# Patient Record
Sex: Female | Born: 1965 | Race: White | Hispanic: No | Marital: Married | Smoking: Former smoker
Health system: Southern US, Community
[De-identification: ages and names within clinical notes are randomized; demographics above are authoritative.]

## PROBLEM LIST (undated history)

## (undated) DIAGNOSIS — E079 Disorder of thyroid, unspecified: Secondary | ICD-10-CM

## (undated) HISTORY — PX: ABDOMINAL HYSTERECTOMY: SHX81

## (undated) HISTORY — PX: TONSILLECTOMY AND ADENOIDECTOMY: SHX28

## (undated) HISTORY — PX: TONSILLECTOMY: SUR1361

---

## 2008-10-03 ENCOUNTER — Encounter: Admission: RE | Admit: 2008-10-03 | Discharge: 2008-10-03 | Payer: Self-pay | Admitting: Family Medicine

## 2008-10-06 ENCOUNTER — Other Ambulatory Visit: Admission: RE | Admit: 2008-10-06 | Discharge: 2008-10-06 | Payer: Self-pay | Admitting: Family Medicine

## 2010-06-02 ENCOUNTER — Encounter: Payer: Self-pay | Admitting: Family Medicine

## 2010-06-07 ENCOUNTER — Other Ambulatory Visit (HOSPITAL_COMMUNITY)
Admission: RE | Admit: 2010-06-07 | Discharge: 2010-06-07 | Disposition: A | Discharge status: Home / Self Care | Payer: BC Managed Care – PPO | Source: Ambulatory Visit | Attending: Family Medicine | Admitting: Family Medicine

## 2010-06-07 DIAGNOSIS — Z124 Encounter for screening for malignant neoplasm of cervix: Secondary | ICD-10-CM | POA: Insufficient documentation

## 2010-06-12 ENCOUNTER — Other Ambulatory Visit: Payer: Self-pay | Admitting: Family Medicine

## 2010-06-12 DIAGNOSIS — N63 Unspecified lump in unspecified breast: Secondary | ICD-10-CM

## 2010-06-21 ENCOUNTER — Ambulatory Visit
Admission: RE | Admit: 2010-06-21 | Discharge: 2010-06-21 | Disposition: A | Discharge status: Home / Self Care | Payer: BC Managed Care – PPO | Source: Ambulatory Visit | Attending: Family Medicine | Admitting: Family Medicine

## 2010-06-21 ENCOUNTER — Other Ambulatory Visit: Payer: Self-pay | Admitting: Family Medicine

## 2010-06-21 DIAGNOSIS — N63 Unspecified lump in unspecified breast: Secondary | ICD-10-CM

## 2011-01-03 ENCOUNTER — Ambulatory Visit: Payer: BC Managed Care – PPO

## 2011-04-25 ENCOUNTER — Encounter: Payer: Self-pay | Admitting: Emergency Medicine

## 2011-04-25 ENCOUNTER — Emergency Department
Admission: EM | Admit: 2011-04-25 | Discharge: 2011-04-25 | Disposition: A | Discharge status: Home / Self Care | Payer: BC Managed Care – PPO | Source: Home / Self Care | Attending: Emergency Medicine | Admitting: Emergency Medicine

## 2011-04-25 DIAGNOSIS — H04129 Dry eye syndrome of unspecified lacrimal gland: Secondary | ICD-10-CM

## 2011-04-25 DIAGNOSIS — H04123 Dry eye syndrome of bilateral lacrimal glands: Secondary | ICD-10-CM

## 2011-04-25 DIAGNOSIS — L851 Acquired keratosis [keratoderma] palmaris et plantaris: Secondary | ICD-10-CM

## 2011-04-25 DIAGNOSIS — R682 Dry mouth, unspecified: Secondary | ICD-10-CM

## 2011-04-25 MED ORDER — PREDNISONE (PAK) 10 MG PO TABS: 10.0000 mg | ORAL_TABLET | Freq: Every day | ORAL | Status: AC

## 2011-04-25 NOTE — ED Notes (Signed)
D/c'd Accutane txs. In September; now has had extremely dry, flaky skin peri-orbital and facial. No new allergens. No recent ABX

## 2011-04-25 NOTE — ED Provider Notes (Signed)
History     CSN: [id number] Arrival date & time: 04/25/2011  4:31 PM   First MD Initiated Contact with Patient 04/25/11 1634      Chief Complaint  Patient presents with  . Rash    (Consider location/radiation/quality/duration/timing/severity/associated sxs/prior treatment) HPI This patient presents today with dry eyes and dry mouth. She was on Accutane for cystic acne but that was discontinued a few months ago. She was also prescribed tretinoin acid which she states made her skin irritating so she stopped it. Over the last few weeks she has noted increased dryness especially of her eyes and her mouth/lips.  She did not report any muscle pain. She does state that she does have a history of some autoimmune issues. No visual problems or other symptoms are noted. She has been using a lot of lip balm and some over-the-counter creams.  History reviewed. No pertinent past medical history.  Past Surgical History  Procedure Date  . Abdominal hysterectomy     No family history on file.  History  Substance Use Topics  . Smoking status: Not on file  . Smokeless tobacco: Not on file  . Alcohol Use:     OB History    Grav Para Term Preterm Abortions TAB SAB Ect Mult Living                  Review of Systems  Allergies  Penicillins and Sulfa antibiotics  Home Medications   Current Outpatient Rx  Name Route Sig Dispense Refill  . PREDNISONE (PAK) 10 MG PO TABS Oral Take 1 tablet (10 mg total) by mouth daily. 6 day pack, use as directed 1 tablet 0    BP 136/77  Pulse 78  Temp(Src) 98.5 F (36.9 C) (Oral)  Resp 16  Ht 5\' 3"  (1.6 m)  Wt 145 lb (65.772 kg)  BMI 25.69 kg/m2  SpO2 99%  Physical Exam  Nursing note and vitals reviewed. Constitutional: She is oriented to person, place, and time. She appears well-developed and well-nourished.  HENT:  Head: Normocephalic and atraumatic.  Eyes: No scleral icterus.       She is mild conjunctival erythema but no exudate and it  does not appear to be a conjunctivitis.  Neck: Neck supple.  Cardiovascular: Regular rhythm and normal heart sounds.   Pulmonary/Chest: Effort normal and breath sounds normal. No respiratory distress.  Neurological: She is alert and oriented to person, place, and time.  Skin: Skin is warm and dry.       She has some dryness and cracking around her lips.  Psychiatric: She has a normal mood and affect. Her speech is normal.    ED Course  Procedures (including critical care time)  Labs Reviewed - No data to display No results found.   1. Dry eyes   2. Dry mouth       MDM    A symptoms may be due to the fact that she was on Accutane as well as tretinoin acid. This can be irritating to the skin. However with her history of rheumatological disorders, possible that she she may have something such as Sjogren's syndrome or sicca syndrome. I've given her prescription for prednisone to maybe stop the irritating cycle. I've advised she stop using her contacts, but she used artificial tears, use hypoallergenic and non-scented lotions, and to use lip balm. I would also like her to call her dermatologist back and schedule another appointment. I have told her to find out whether or not  they think that this could be some type of autoimmune issue or if they had any other treatment options.  Further treatment of these issues would need to be treated by a specialist.  Lily Kocher, MD 04/25/11 [phone]

## 2011-07-10 ENCOUNTER — Other Ambulatory Visit: Payer: Self-pay | Admitting: Family Medicine

## 2011-07-10 DIAGNOSIS — Z1231 Encounter for screening mammogram for malignant neoplasm of breast: Secondary | ICD-10-CM

## 2011-07-18 ENCOUNTER — Ambulatory Visit
Admission: RE | Admit: 2011-07-18 | Discharge: 2011-07-18 | Disposition: A | Discharge status: Home / Self Care | Payer: BC Managed Care – PPO | Source: Ambulatory Visit | Attending: Family Medicine | Admitting: Family Medicine

## 2011-07-18 DIAGNOSIS — Z1231 Encounter for screening mammogram for malignant neoplasm of breast: Secondary | ICD-10-CM

## 2012-10-20 LAB — HEPATIC FUNCTION PANEL
ALK PHOS: 62 U/L (ref 25–125)
ALT: 12 U/L (ref 7–35)
AST: 14 U/L (ref 13–35)
BILIRUBIN, TOTAL: 1.6 mg/dL

## 2012-10-20 LAB — BASIC METABOLIC PANEL
BUN: 11 mg/dL (ref 4–21)
Creatinine: 0.7 mg/dL (ref 0.5–1.1)
GLUCOSE: 85 mg/dL
Potassium: 4.2 mmol/L (ref 3.4–5.3)
SODIUM: 137 mmol/L (ref 137–147)

## 2012-10-20 LAB — CBC AND DIFFERENTIAL
HEMATOCRIT: 45 % (ref 36–46)
Hemoglobin: 15.4 g/dL (ref 12.0–16.0)
Platelets: 221 10*3/uL (ref 150–399)
WBC: 7 10^3/mL

## 2012-10-20 LAB — HEMOGLOBIN A1C: Hgb A1c MFr Bld: 5.3 % (ref 4.0–6.0)

## 2012-10-20 LAB — TSH: TSH: 1.61 u[IU]/mL (ref 0.41–5.90)

## 2012-11-08 ENCOUNTER — Other Ambulatory Visit: Payer: Self-pay

## 2012-11-08 DIAGNOSIS — Z1231 Encounter for screening mammogram for malignant neoplasm of breast: Secondary | ICD-10-CM

## 2012-11-19 ENCOUNTER — Ambulatory Visit
Admission: RE | Admit: 2012-11-19 | Discharge: 2012-11-19 | Disposition: A | Discharge status: Home / Self Care | Payer: BC Managed Care – PPO | Source: Ambulatory Visit

## 2012-11-19 DIAGNOSIS — Z1231 Encounter for screening mammogram for malignant neoplasm of breast: Secondary | ICD-10-CM

## 2013-05-18 ENCOUNTER — Emergency Department
Admission: EM | Admit: 2013-05-18 | Discharge: 2013-05-18 | Disposition: A | Discharge status: Home / Self Care | Payer: BC Managed Care – PPO | Source: Home / Self Care | Attending: Family Medicine | Admitting: Family Medicine

## 2013-05-18 ENCOUNTER — Encounter: Payer: Self-pay | Admitting: Emergency Medicine

## 2013-05-18 DIAGNOSIS — J069 Acute upper respiratory infection, unspecified: Secondary | ICD-10-CM

## 2013-05-18 HISTORY — DX: Disorder of thyroid, unspecified: E07.9

## 2013-05-18 MED ORDER — AZITHROMYCIN 250 MG PO TABS: ORAL_TABLET | ORAL | Status: DC

## 2013-05-18 MED ORDER — BENZONATATE 200 MG PO CAPS: 200.0000 mg | ORAL_CAPSULE | Freq: Every day | ORAL | Status: DC

## 2013-05-18 NOTE — ED Provider Notes (Signed)
CSN: [id number]     Arrival date & time 05/18/13  1449 History   First MD Initiated Contact with Patient 05/18/13 1544     Chief Complaint  Patient presents with  . Chills  . Cough     HPI Comments: Patient complains of 3 to 4 day history of gradually developing URI symptoms including myalgias, mild sore throat, nausea, fatigue, chills, and mild cough.   She has not had influenza immunization for this season.    The history is provided by the patient.    Past Medical History  Diagnosis Date  . Thyroid disease    Past Surgical History  Procedure Laterality Date  . Abdominal hysterectomy    . Tonsillectomy and adenoidectomy    . Tonsillectomy     Family History  Problem Relation Age of Onset  . Diabetes Mother   . Stroke Mother   . Heart attack Mother   . Cancer Father   . Hypertension Father    History  Substance Use Topics  . Smoking status: Former Games developermoker  . Smokeless tobacco: Never Used  . Alcohol Use: Yes   OB History   Grav Para Term Preterm Abortions TAB SAB Ect Mult Living                 Review of Systems + sore throat + cough No pleuritic pain No wheezing No nasal congestion ? post-nasal drainage No sinus pain/pressure No itchy/red eyes No earache No hemoptysis No SOB No fever, + chills + nausea No vomiting No abdominal pain No diarrhea No urinary symptoms No skin rash + fatigue + myalgias + headache Used OTC meds without relief  Allergies  Penicillins and Sulfa antibiotics  Home Medications   Current Outpatient Rx  Name  Route  Sig  Dispense  Refill  . azithromycin (ZITHROMAX Z-PAK) 250 MG tablet      Take 2 tabs today; then begin one tab once daily for 4 more days. (Rx void after 05/26/13)   6 each   0   . benzonatate (TESSALON) 200 MG capsule   Oral   Take 1 capsule (200 mg total) by mouth at bedtime. Take as needed for cough   12 capsule   0    BP 120/81  Pulse 72  Temp(Src) 98.3 F (36.8 C) (Oral)  Resp 16  Ht 5\' 3"   (1.6 m)  Wt 162 lb (73.483 kg)  BMI 28.70 kg/m2  SpO2 99% Physical Exam Nursing notes and Vital Signs reviewed. Appearance:  Patient appears healthy, stated age, and in no acute distress Eyes:  Pupils are equal, round, and reactive to light and accomodation.  Extraocular movement is intact.  Conjunctivae are not inflamed  Ears:  Canals normal.  Tympanic membranes normal.  Nose:  Mildly congested turbinates.  No sinus tenderness.   Pharynx:  Normal Neck:  Supple.  No adenopathy  Lungs:  Clear to auscultation.  Breath sounds are equal.  Heart:  Regular rate and rhythm without murmurs, rubs, or gallops.  Abdomen:  Nontender without masses or hepatosplenomegaly.  Bowel sounds are present.  No CVA or flank tenderness.  Extremities:  No edema.  No calf tenderness Skin:  No rash present.   ED Course  Procedures        MDM   1. Acute upper respiratory infections of unspecified site; suspect viral URI    There is no evidence of bacterial infection today.  Treat symptomatically for now  Prescription written for Benzonatate [facility](Tessalon) to take  at bedtime for night-time cough.   Take plain Mucinex (1200 mg guaifenesin) twice daily for cough and congestion.  May add Sudafed for sinus congestion.  Increase fluid intake, rest. May use Afrin nasal spray (or generic oxymetazoline) twice daily for about 5 days.  Also recommend using saline nasal spray several times daily and saline nasal irrigation (AYR is a common brand) Try warm salt water gargles for sore throat.  Stop all antihistamines for now, and other non-prescription cough/cold preparations. May take Ibuprofen 200mg , 4 tabs every 8 hours with food for chest/sternum discomfort. Begin Azithromycin if not improving about 5 days or if persistent fever develops (Given a prescription to hold, with an expiration date)   Follow-up with family doctor if not improving 7 to 10 days.      Lattie Haw, MD 05/21/13 1001

## 2013-05-18 NOTE — Discharge Instructions (Signed)
Take plain Mucinex (1200 mg guaifenesin) twice daily for cough and congestion.  May add Sudafed for sinus congestion.   Increase fluid intake, rest. °May use Afrin nasal spray (or generic oxymetazoline) twice daily for about 5 days.  Also recommend using saline nasal spray several times daily and saline nasal irrigation (AYR is a common brand) °Try warm salt water gargles for sore throat.  °Stop all antihistamines for now, and other non-prescription cough/cold preparations. °May take Ibuprofen 200mg, 4 tabs every 8 hours with food for chest/sternum discomfort. °Begin Azithromycin if not improving about 5 days or if persistent fever develops  °Follow-up with family doctor if not improving 7 to 10 days.  °

## 2013-05-18 NOTE — ED Notes (Signed)
Gabrielle Riddle c/o chills/sweats, dry cough, HA and body aches x 4 days. NO flu vac this season.

## 2013-05-20 ENCOUNTER — Ambulatory Visit (INDEPENDENT_AMBULATORY_CARE_PROVIDER_SITE_OTHER): Payer: BC Managed Care – PPO

## 2013-05-20 ENCOUNTER — Ambulatory Visit (INDEPENDENT_AMBULATORY_CARE_PROVIDER_SITE_OTHER): Payer: BC Managed Care – PPO | Admitting: Physician Assistant

## 2013-05-20 ENCOUNTER — Encounter: Payer: Self-pay | Admitting: Physician Assistant

## 2013-05-20 VITALS — BP 121/61 | HR 98 | Ht 62.5 in | Wt 161.0 lb

## 2013-05-20 DIAGNOSIS — N904 Leukoplakia of vulva: Secondary | ICD-10-CM

## 2013-05-20 DIAGNOSIS — F39 Unspecified mood [affective] disorder: Secondary | ICD-10-CM

## 2013-05-20 DIAGNOSIS — R4586 Emotional lability: Secondary | ICD-10-CM

## 2013-05-20 DIAGNOSIS — L940 Localized scleroderma [morphea]: Secondary | ICD-10-CM

## 2013-05-20 DIAGNOSIS — M79671 Pain in right foot: Secondary | ICD-10-CM

## 2013-05-20 DIAGNOSIS — N951 Menopausal and female climacteric states: Secondary | ICD-10-CM

## 2013-05-20 DIAGNOSIS — M79609 Pain in unspecified limb: Secondary | ICD-10-CM

## 2013-05-20 DIAGNOSIS — E559 Vitamin D deficiency, unspecified: Secondary | ICD-10-CM

## 2013-05-20 DIAGNOSIS — M7730 Calcaneal spur, unspecified foot: Secondary | ICD-10-CM

## 2013-05-20 DIAGNOSIS — K219 Gastro-esophageal reflux disease without esophagitis: Secondary | ICD-10-CM

## 2013-05-20 DIAGNOSIS — R232 Flushing: Secondary | ICD-10-CM

## 2013-05-20 DIAGNOSIS — I8390 Asymptomatic varicose veins of unspecified lower extremity: Secondary | ICD-10-CM

## 2013-05-20 MED ORDER — OMEPRAZOLE 40 MG PO CPDR: 40.0000 mg | DELAYED_RELEASE_CAPSULE | Freq: Every day | ORAL | Status: AC

## 2013-05-20 MED ORDER — VENLAFAXINE HCL ER 37.5 MG PO CP24: 37.5000 mg | ORAL_CAPSULE | Freq: Every day | ORAL | Status: DC

## 2013-05-20 NOTE — Patient Instructions (Addendum)
Xray of right foot. Use ibuprofen as needed.   Heel Spur A heel spur is a hook of bone that can form on the calcaneus (the heel bone and the largest bone of the foot). Heel spurs are often associated with plantar fasciitis and usually come in people who have had the problem for an extended period of time. The cause of the relationship is unknown. The pain associated with them is thought to be caused by an inflammation (soreness and redness) of the plantar fascia rather than the spur itself. The plantar fascia is a thick fibrous like tissue that runs from the calcaneus (heel bone) to the ball of the foot. This strong, tight tissue helps maintain the arch of your foot. It helps distribute the weight across your foot as you walk or run. Stresses placed on the plantar fascia can be tremendous. When it is inflamed normal activities become painful. Pain is worse in the morning after sleeping. After sleeping the plantar fascia is tight. The first movements stretch the fascia and this causes pain. As the tendon loosens, the pain usually gets better. It often returns with too much standing or walking.  About 70% of patients with plantar fasciitis have a heel spur. About half of people without foot pain also have heel spurs. DIAGNOSIS  The diagnosis of a heel spur is made by X-ray. The X-ray shows a hook of bone protruding from the bottom of the calcaneus at the point where the plantar fascia is attached to the heel bone.  TREATMENT  It is necessary to find out what is causing the stretching of the plantar fascia. If the cause is over-pronation (flat feet), orthotics and proper foot ware may help.  Stretching exercises, losing weight, wearing shoes that have a cushioned heel that absorbs shock, and elevating the heel with the use of a heel cradle, heel cup, or orthotics may all help. Heel cradles and heel cups provide extra comfort and cushion to the heel, and reduce the amount of shock to the sore area. AVOIDING  THE PAIN OF PLANTAR FASCIITIS AND HEEL SPURS  Consult a sports medicine professional before beginning a new exercise program.  Walking programs offer a good workout. There is a lower chance of overuse injuries common to the runners. There is less impact and less jarring of the joints.  Begin all new exercise programs slowly. If problems or pains develop, decrease the amount of time or distance until you are at a comfortable level.  Wear good shoes and replace them regularly.  Stretch your foot and the heel cords at the back of the ankle (Achilles tendons) both before and after exercise.  Run or exercise on even surfaces that are not hard. For example, asphalt is better than pavement.  Do not run barefoot on hard surfaces.  If using a treadmill, vary the incline.  Do not continue to workout if you have foot or joint problems. Seek professional help if they do not improve. HOME CARE INSTRUCTIONS   Avoid activities that cause you pain until you recover.  Use ice or cold packs to the problem or painful areas after working out.  Only take over-the-counter or prescription medicines for pain, discomfort, or fever as directed by your caregiver.  Soft shoe inserts or athletic shoes with air or gel sole cushions may be helpful.  If problems continue or become more severe, consult a sports medicine caregiver. Cortisone is a potent anti-inflammatory medication that may be injected into the painful area. You can  discuss this treatment with your caregiver. MAKE SURE YOU:   Understand these instructions.  Will watch your condition.  Will get help right away if you are not doing well or get worse. Document Released: 06/04/2005 Document Revised: 07/21/2011 Document Reviewed: 08/06/2005 [facility] Patient Information 2014 [place], [place].   Plantar Fasciitis (Heel Spur Syndrome) with Rehab The plantar fascia is a fibrous, ligament-like, soft-tissue structure that spans the bottom of the foot.  Plantar fasciitis is a condition that causes pain in the foot due to inflammation of the tissue. SYMPTOMS   Pain and tenderness on the underneath side of the foot.  Pain that worsens with standing or walking. CAUSES  Plantar fasciitis is caused by irritation and injury to the plantar fascia on the underneath side of the foot. Common mechanisms of injury include:  Direct trauma to bottom of the foot.  Damage to a small nerve that runs under the foot where the main fascia attaches to the heel bone.  Stress placed on the plantar fascia due to bone spurs. RISK INCREASES WITH:   Activities that place stress on the plantar fascia (running, jumping, pivoting, or cutting).  Poor strength and flexibility.  Improperly fitted shoes.  Tight calf muscles.  Flat feet.  Failure to warm-up properly before activity.  Obesity. PREVENTION  Warm up and stretch properly before activity.  Allow for adequate recovery between workouts.  Maintain physical fitness:  Strength, flexibility, and endurance.  Cardiovascular fitness.  Maintain a health body weight.  Avoid stress on the plantar fascia.  Wear properly fitted shoes, including arch supports for individuals who have flat feet. PROGNOSIS  If treated properly, then the symptoms of plantar fasciitis usually resolve without surgery. However, occasionally surgery is necessary. RELATED COMPLICATIONS   Recurrent symptoms that may result in a chronic condition.  Problems of the lower back that are caused by compensating for the injury, such as limping.  Pain or weakness of the foot during push-off following surgery.  Chronic inflammation, scarring, and partial or complete fascia tear, occurring more often from repeated injections. TREATMENT  Treatment initially involves the use of ice and medication to help reduce pain and inflammation. The use of strengthening and stretching exercises may help reduce pain with activity, especially  stretches of the Achilles tendon. These exercises may be performed at home or with a therapist. Your caregiver may recommend that you use heel cups of arch supports to help reduce stress on the plantar fascia. Occasionally, corticosteroid injections are given to reduce inflammation. If symptoms persist for greater than 6 months despite non-surgical (conservative), then surgery may be recommended.  MEDICATION   If pain medication is necessary, then nonsteroidal anti-inflammatory medications, such as aspirin and ibuprofen, or other minor pain relievers, such as acetaminophen, are often recommended.  Do not take pain medication within 7 days before surgery.  Prescription pain relievers may be given if deemed necessary by your caregiver. Use only as directed and only as much as you need.  Corticosteroid injections may be given by your caregiver. These injections should be reserved for the most serious cases, because they may only be given a certain number of times. HEAT AND COLD  Cold treatment (icing) relieves pain and reduces inflammation. Cold treatment should be applied for 10 to 15 minutes every 2 to 3 hours for inflammation and pain and immediately after any activity that aggravates your symptoms. Use ice packs or massage the area with a piece of ice (ice massage).  Heat treatment may be used prior  to performing the stretching and strengthening activities prescribed by your caregiver, physical therapist, or athletic trainer. Use a heat pack or soak the injury in warm water. SEEK IMMEDIATE MEDICAL CARE IF:  Treatment seems to offer no benefit, or the condition worsens.  Any medications produce adverse side effects. EXERCISES RANGE OF MOTION (ROM) AND STRETCHING EXERCISES - Plantar Fasciitis (Heel Spur Syndrome) These exercises may help you when beginning to rehabilitate your injury. Your symptoms may resolve with or without further involvement from your physician, physical therapist or  athletic trainer. While completing these exercises, remember:   Restoring tissue flexibility helps normal motion to return to the joints. This allows healthier, less painful movement and activity.  An effective stretch should be held for at least 30 seconds.  A stretch should never be painful. You should only feel a gentle lengthening or release in the stretched tissue. RANGE OF MOTION - Toe Extension, Flexion  Sit with your right / left leg crossed over your opposite knee.  Grasp your toes and gently pull them back toward the top of your foot. You should feel a stretch on the bottom of your toes and/or foot.  Hold this stretch for __________ seconds.  Now, gently pull your toes toward the bottom of your foot. You should feel a stretch on the top of your toes and or foot.  Hold this stretch for __________ seconds. Repeat __________ times. Complete this stretch __________ times per day.  RANGE OF MOTION - Ankle Dorsiflexion, Active Assisted  Remove shoes and sit on a chair that is preferably not on a carpeted surface.  Place right / left foot under knee. Extend your opposite leg for support.  Keeping your heel down, slide your right / left foot back toward the chair until you feel a stretch at your ankle or calf. If you do not feel a stretch, slide your bottom forward to the edge of the chair, while still keeping your heel down.  Hold this stretch for __________ seconds. Repeat __________ times. Complete this stretch __________ times per day.  STRETCH  Gastroc, Standing  Place hands on wall.  Extend right / left leg, keeping the front knee somewhat bent.  Slightly point your toes inward on your back foot.  Keeping your right / left heel on the floor and your knee straight, shift your weight toward the wall, not allowing your back to arch.  You should feel a gentle stretch in the right / left calf. Hold this position for __________ seconds. Repeat __________ times. Complete this  stretch __________ times per day. STRETCH  Soleus, Standing  Place hands on wall.  Extend right / left leg, keeping the other knee somewhat bent.  Slightly point your toes inward on your back foot.  Keep your right / left heel on the floor, bend your back knee, and slightly shift your weight over the back leg so that you feel a gentle stretch deep in your back calf.  Hold this position for __________ seconds. Repeat __________ times. Complete this stretch __________ times per day. STRETCH  Gastrocsoleus, Standing  Note: This exercise can place a lot of stress on your foot and ankle. Please complete this exercise only if specifically instructed by your caregiver.   Place the ball of your right / left foot on a step, keeping your other foot firmly on the same step.  Hold on to the wall or a rail for balance.  Slowly lift your other foot, allowing your body weight to press  your heel down over the edge of the step.  You should feel a stretch in your right / left calf.  Hold this position for __________ seconds.  Repeat this exercise with a slight bend in your right / left knee. Repeat __________ times. Complete this stretch __________ times per day.  STRENGTHENING EXERCISES - Plantar Fasciitis (Heel Spur Syndrome)  These exercises may help you when beginning to rehabilitate your injury. They may resolve your symptoms with or without further involvement from your physician, physical therapist or athletic trainer. While completing these exercises, remember:   Muscles can gain both the endurance and the strength needed for everyday activities through controlled exercises.  Complete these exercises as instructed by your physician, physical therapist or athletic trainer. Progress the resistance and repetitions only as guided. STRENGTH - Towel Curls  Sit in a chair positioned on a non-carpeted surface.  Place your foot on a towel, keeping your heel on the floor.  Pull the towel toward  your heel by only curling your toes. Keep your heel on the floor.  If instructed by your physician, physical therapist or athletic trainer, add ____________________ at the end of the towel. Repeat __________ times. Complete this exercise __________ times per day. STRENGTH - Ankle Inversion  Secure one end of a rubber exercise band/tubing to a fixed object (table, pole). Loop the other end around your foot just before your toes.  Place your fists between your knees. This will focus your strengthening at your ankle.  Slowly, pull your big toe up and in, making sure the band/tubing is positioned to resist the entire motion.  Hold this position for __________ seconds.  Have your muscles resist the band/tubing as it slowly pulls your foot back to the starting position. Repeat __________ times. Complete this exercises __________ times per day.  Document Released: 04/28/2005 Document Revised: 07/21/2011 Document Reviewed: 08/10/2008 [facility]ExitCare Patient Information 2014 [place].

## 2013-05-20 NOTE — Progress Notes (Signed)
   Subjective:    Patient ID: Gabrielle Riddle, female    DOB: 1965-12-20, 48 y.o.   MRN: [id number]  HPI Pt is a 48 yo female who presents to the clinic to establish care. PMH is negative for any ongoing conditions. She is currently not on any medications.  . Active Ambulatory Problems    Diagnosis Date Noted  . Lichen sclerosus of female genitalia 05/22/2013  . Vitamin D deficiency 05/22/2013  . Varicose veins 05/22/2013   Resolved Ambulatory Problems    Diagnosis Date Noted  . No Resolved Ambulatory Problems   Past Medical History  Diagnosis Date  . Thyroid disease    Pt is aware needs CPE. A few concerns today.   Right heel pain started 2 weeks ago. Pain made worse with palpation and pressure over right heel. Trying to walk more but very painful. Hx of varicose veins. Tried nothing to make better.   Hot flashes started really bad in the last month. She will have 5-15 a day. She would like something to help. Tried nothing. Hysterectomy.     GERD hx- controlled with prilosec.    Review of Systems  All other systems reviewed and are negative.       Objective:   Physical Exam  Constitutional: She is oriented to person, place, and time. She appears well-developed and well-nourished.  HENT:  Head: Normocephalic and atraumatic.  Cardiovascular: Normal rate, regular rhythm and normal heart sounds.   Pulmonary/Chest: Effort normal and breath sounds normal.  Musculoskeletal:  Pain with palpation over right heel. No pain over fascia. ROM and strength of right foot normal without pain.   Neurological: She is alert and oriented to person, place, and time.  Psychiatric: She has a normal mood and affect. Her behavior is normal.          Assessment & Plan:  Heel pain, right- suspect heel spur. Gave HO. Start icing, rest, and exercises. Ibuprofen for pain control.  Will get xray to confirm.   GERD- Refilled prilosec.   Hot flashes/mood swings-PHQ-9 was 4. GAD-7 was 8. Mood  swings are likely due to hormonal changes. Will try effexor for both hot flashes and mood swings. Increase to 75mg  daily after 1 week. Follow up in 6 weeks. Discussed HRT. Would like to try other options first.

## 2013-05-22 DIAGNOSIS — R232 Flushing: Secondary | ICD-10-CM | POA: Insufficient documentation

## 2013-05-22 DIAGNOSIS — I8390 Asymptomatic varicose veins of unspecified lower extremity: Secondary | ICD-10-CM | POA: Insufficient documentation

## 2013-05-22 DIAGNOSIS — N904 Leukoplakia of vulva: Secondary | ICD-10-CM | POA: Insufficient documentation

## 2013-05-22 DIAGNOSIS — R4586 Emotional lability: Secondary | ICD-10-CM | POA: Insufficient documentation

## 2013-05-22 DIAGNOSIS — E559 Vitamin D deficiency, unspecified: Secondary | ICD-10-CM | POA: Insufficient documentation

## 2013-05-22 DIAGNOSIS — K219 Gastro-esophageal reflux disease without esophagitis: Secondary | ICD-10-CM | POA: Insufficient documentation

## 2013-05-22 DIAGNOSIS — M79673 Pain in unspecified foot: Secondary | ICD-10-CM | POA: Insufficient documentation

## 2013-06-15 ENCOUNTER — Encounter: Payer: Self-pay | Admitting: *Deleted

## 2013-06-17 ENCOUNTER — Encounter: Payer: Self-pay | Admitting: Physician Assistant

## 2013-06-17 ENCOUNTER — Ambulatory Visit (INDEPENDENT_AMBULATORY_CARE_PROVIDER_SITE_OTHER): Payer: BC Managed Care – PPO | Admitting: Physician Assistant

## 2013-06-17 VITALS — BP 112/70 | HR 66 | Wt 161.0 lb

## 2013-06-17 DIAGNOSIS — Z131 Encounter for screening for diabetes mellitus: Secondary | ICD-10-CM

## 2013-06-17 DIAGNOSIS — R4586 Emotional lability: Secondary | ICD-10-CM

## 2013-06-17 DIAGNOSIS — E559 Vitamin D deficiency, unspecified: Secondary | ICD-10-CM

## 2013-06-17 DIAGNOSIS — F39 Unspecified mood [affective] disorder: Secondary | ICD-10-CM

## 2013-06-17 DIAGNOSIS — R232 Flushing: Secondary | ICD-10-CM

## 2013-06-17 DIAGNOSIS — R7301 Impaired fasting glucose: Secondary | ICD-10-CM

## 2013-06-17 DIAGNOSIS — N951 Menopausal and female climacteric states: Secondary | ICD-10-CM

## 2013-06-17 DIAGNOSIS — Z13220 Encounter for screening for lipoid disorders: Secondary | ICD-10-CM

## 2013-06-17 MED ORDER — VENLAFAXINE HCL ER 37.5 MG PO CP24: 37.5000 mg | ORAL_CAPSULE | Freq: Every day | ORAL | Status: DC

## 2013-06-17 NOTE — Progress Notes (Signed)
   Subjective:    Patient ID: Gabrielle DienerMollie Hunt, female    DOB: 03/11/1966, 48 y.o.   MRN: [id number]  HPI Patient is a 48 year old female who presents to the clinic to followup own mood swings and hot flashes. She started Effexor 37.5 mg and she is feeling 80% improvement of symptoms. Patient reports that she feels much happier and has more energy. Her mood seems much more stable. She tried to increase 37.5-2 tabs but felt like it made her more fatigued. She is very happy with the benefits at the 37.5 mg marked on like to stay there.  Patient would also like to get fasting labs slip today.   Review of Systems     Objective:   Physical Exam  Constitutional: She is oriented to person, place, and time. She appears well-developed and well-nourished.  HENT:  Head: Normocephalic and atraumatic.  Cardiovascular: Normal rate, regular rhythm and normal heart sounds.   Pulmonary/Chest: Effort normal and breath sounds normal.  Neurological: She is alert and oriented to person, place, and time.  Skin: Skin is dry.  Psychiatric: She has a normal mood and affect. Her behavior is normal.          Assessment & Plan:  Mood swings/Hot flashes- am very happy with 80% improvement of symptoms. Will continue on Effexor 37.5 mg once a day. Refilled for 6 months. Patient can followup at any time if symptoms seem to be coming back. Encouraged regular exercise.  Screening labs for cholesterol, diabetes and vitamin D deficiency were ordered today. Patient aware to get labs done when fasting for at least 8 hours.

## 2013-06-24 LAB — COMPLETE METABOLIC PANEL WITH GFR
ALK PHOS: 58 U/L (ref 39–117)
ALT: 17 U/L (ref 0–35)
AST: 16 U/L (ref 0–37)
Albumin: 4 g/dL (ref 3.5–5.2)
BILIRUBIN TOTAL: 0.9 mg/dL (ref 0.2–1.2)
BUN: 13 mg/dL (ref 6–23)
CO2: 27 mEq/L (ref 19–32)
Calcium: 9.4 mg/dL (ref 8.4–10.5)
Chloride: 106 mEq/L (ref 96–112)
Creat: 0.6 mg/dL (ref 0.50–1.10)
GFR, Est African American: >89 mL/min
GFR, Est Non African American: >89 mL/min
Glucose, Bld: 112 mg/dL — ABNORMAL HIGH (ref 70–99)
Potassium: 4.3 mEq/L (ref 3.5–5.3)
SODIUM: 140 meq/L (ref 135–145)
TOTAL PROTEIN: 6.8 g/dL (ref 6.0–8.3)

## 2013-06-24 LAB — LIPID PANEL
CHOL/HDL RATIO: 3.4 ratio
Cholesterol: 183 mg/dL (ref 0–200)
HDL: 54 mg/dL (ref >39)
LDL Cholesterol: 112 mg/dL — ABNORMAL HIGH (ref 0–99)
Triglycerides: 86 mg/dL (ref <150)
VLDL: 17 mg/dL (ref 0–40)

## 2013-06-24 LAB — TSH: TSH: 3.023 u[IU]/mL (ref 0.350–4.500)

## 2013-06-25 LAB — VITAMIN D 25 HYDROXY (VIT D DEFICIENCY, FRACTURES): Vit D, 25-Hydroxy: 33 ng/mL (ref 30–89)

## 2013-06-27 NOTE — Addendum Note (Signed)
Addended by: Juel BurrowBENDER, Braysen Cloward L on: 06/27/2013 02:29 PM   Modules accepted: Orders

## 2013-07-05 LAB — HEMOGLOBIN A1C
HEMOGLOBIN A1C: 5.4 % (ref <5.7)
MEAN PLASMA GLUCOSE: 108 mg/dL (ref <117)

## 2013-11-24 ENCOUNTER — Other Ambulatory Visit: Payer: Self-pay | Admitting: Physician Assistant

## 2013-11-24 DIAGNOSIS — Z139 Encounter for screening, unspecified: Secondary | ICD-10-CM

## 2013-12-06 ENCOUNTER — Ambulatory Visit (INDEPENDENT_AMBULATORY_CARE_PROVIDER_SITE_OTHER): Payer: Medicaid Other

## 2013-12-06 DIAGNOSIS — Z1231 Encounter for screening mammogram for malignant neoplasm of breast: Secondary | ICD-10-CM

## 2013-12-06 DIAGNOSIS — Z139 Encounter for screening, unspecified: Secondary | ICD-10-CM

## 2013-12-30 ENCOUNTER — Ambulatory Visit: Payer: Medicaid Other | Admitting: Physician Assistant

## 2015-01-02 ENCOUNTER — Other Ambulatory Visit: Payer: Self-pay | Admitting: Family Medicine

## 2015-01-02 DIAGNOSIS — Z1231 Encounter for screening mammogram for malignant neoplasm of breast: Secondary | ICD-10-CM

## 2015-01-19 ENCOUNTER — Ambulatory Visit (HOSPITAL_BASED_OUTPATIENT_CLINIC_OR_DEPARTMENT_OTHER)
Admission: RE | Admit: 2015-01-19 | Discharge: 2015-01-19 | Disposition: A | Discharge status: Home / Self Care | Payer: PRIVATE HEALTH INSURANCE | Source: Ambulatory Visit | Attending: Family Medicine | Admitting: Family Medicine

## 2015-01-19 DIAGNOSIS — Z1231 Encounter for screening mammogram for malignant neoplasm of breast: Secondary | ICD-10-CM | POA: Diagnosis present

## 2015-01-19 DIAGNOSIS — R928 Other abnormal and inconclusive findings on diagnostic imaging of breast: Secondary | ICD-10-CM | POA: Diagnosis not present

## 2015-01-22 ENCOUNTER — Other Ambulatory Visit: Payer: Self-pay | Admitting: Family Medicine

## 2015-01-22 DIAGNOSIS — N6489 Other specified disorders of breast: Secondary | ICD-10-CM

## 2015-01-24 ENCOUNTER — Ambulatory Visit
Admission: RE | Admit: 2015-01-24 | Discharge: 2015-01-24 | Disposition: A | Discharge status: Home / Self Care | Payer: 59 | Source: Ambulatory Visit | Attending: Family Medicine | Admitting: Family Medicine

## 2015-01-24 ENCOUNTER — Other Ambulatory Visit: Payer: Self-pay | Admitting: Family Medicine

## 2015-01-24 DIAGNOSIS — R928 Other abnormal and inconclusive findings on diagnostic imaging of breast: Secondary | ICD-10-CM

## 2015-01-25 ENCOUNTER — Ambulatory Visit: Payer: Medicaid Other

## 2015-01-31 ENCOUNTER — Ambulatory Visit: Payer: Medicaid Other

## 2015-10-29 ENCOUNTER — Emergency Department (INDEPENDENT_AMBULATORY_CARE_PROVIDER_SITE_OTHER): Payer: BLUE CROSS/BLUE SHIELD

## 2015-10-29 ENCOUNTER — Emergency Department (INDEPENDENT_AMBULATORY_CARE_PROVIDER_SITE_OTHER)
Admission: EM | Admit: 2015-10-29 | Discharge: 2015-10-29 | Disposition: A | Discharge status: Home / Self Care | Payer: BLUE CROSS/BLUE SHIELD | Source: Home / Self Care | Attending: Family Medicine | Admitting: Family Medicine

## 2015-10-29 DIAGNOSIS — S139XXA Sprain of joints and ligaments of unspecified parts of neck, initial encounter: Secondary | ICD-10-CM

## 2015-10-29 DIAGNOSIS — R202 Paresthesia of skin: Secondary | ICD-10-CM

## 2015-10-29 DIAGNOSIS — M542 Cervicalgia: Secondary | ICD-10-CM | POA: Diagnosis not present

## 2015-10-29 DIAGNOSIS — S134XXA Sprain of ligaments of cervical spine, initial encounter: Secondary | ICD-10-CM | POA: Diagnosis not present

## 2015-10-29 MED ORDER — CYCLOBENZAPRINE HCL 10 MG PO TABS: ORAL_TABLET | ORAL | Status: DC

## 2015-10-29 NOTE — Discharge Instructions (Signed)
Apply ice pack for 20 to 30 minutes, every 3 to 4 hours.  Continue until pain decreases.  Begin stretching and range of motion exercises in about 5 days as tolerated.  May take Tylenol as needed for pain.   Cervical Sprain A cervical sprain is an injury in the neck in which the strong, fibrous tissues (ligaments) that connect your neck bones stretch or tear. Cervical sprains can range from mild to severe. Severe cervical sprains can cause the neck vertebrae to be unstable. This can lead to damage of the spinal cord and can result in serious nervous system problems. The amount of time it takes for a cervical sprain to get better depends on the cause and extent of the injury. Most cervical sprains heal in 1 to 3 weeks. CAUSES  Severe cervical sprains may be caused by:   Contact sport injuries (such as from football, rugby, wrestling, hockey, auto racing, gymnastics, diving, martial arts, or boxing).   Motor vehicle collisions.   Whiplash injuries. This is an injury from a sudden forward and backward whipping movement of the head and neck.  Falls.  Mild cervical sprains may be caused by:   Being in an awkward position, such as while cradling a telephone between your ear and shoulder.   Sitting in a chair that does not offer proper support.   Working at a poorly Marketing executive station.   Looking up or down for long periods of time.  SYMPTOMS   Pain, soreness, stiffness, or a burning sensation in the front, back, or sides of the neck. This discomfort may develop immediately after the injury or slowly, 24 hours or more after the injury.   Pain or tenderness directly in the middle of the back of the neck.   Shoulder or upper back pain.   Limited ability to move the neck.   Headache.   Dizziness.   Weakness, numbness, or tingling in the hands or arms.   Muscle spasms.   Difficulty swallowing or chewing.   Tenderness and swelling of the neck.  DIAGNOSIS  Most  of the time your health care provider can diagnose a cervical sprain by taking your history and doing a physical exam. Your health care provider will ask about previous neck injuries and any known neck problems, such as arthritis in the neck. X-rays may be taken to find out if there are any other problems, such as with the bones of the neck. Other tests, such as a CT scan or MRI, may also be needed.  TREATMENT  Treatment depends on the severity of the cervical sprain. Mild sprains can be treated with rest, keeping the neck in place (immobilization), and pain medicines. Severe cervical sprains are immediately immobilized. Further treatment is done to help with pain, muscle spasms, and other symptoms and may include:  Medicines, such as pain relievers, numbing medicines, or muscle relaxants.   Physical therapy. This may involve stretching exercises, strengthening exercises, and posture training. Exercises and improved posture can help stabilize the neck, strengthen muscles, and help stop symptoms from returning.  HOME CARE INSTRUCTIONS   Put ice on the injured area.   Put ice in a plastic bag.   Place a towel between your skin and the bag.   Leave the ice on for 15-20 minutes, 3-4 times a day.   If your injury was severe, you may have been given a cervical collar to wear. A cervical collar is a two-piece collar designed to keep your neck from moving  while it heals.  Do not remove the collar unless instructed by your health care provider.  If you have long hair, keep it outside of the collar.  Ask your health care provider before making any adjustments to your collar. Minor adjustments may be required over time to improve comfort and reduce pressure on your chin or on the back of your head.  Ifyou are allowed to remove the collar for cleaning or bathing, follow your health care provider's instructions on how to do so safely.  Keep your collar clean by wiping it with mild soap and water  and drying it completely. If the collar you have been given includes removable pads, remove them every 1-2 days and hand wash them with soap and water. Allow them to air dry. They should be completely dry before you wear them in the collar.  If you are allowed to remove the collar for cleaning and bathing, wash and dry the skin of your neck. Check your skin for irritation or sores. If you see any, tell your health care provider.  Do not drive while wearing the collar.   Only take over-the-counter or prescription medicines for pain, discomfort, or fever as directed by your health care provider.   Keep all follow-up appointments as directed by your health care provider.   Keep all physical therapy appointments as directed by your health care provider.   Make any needed adjustments to your workstation to promote good posture.   Avoid positions and activities that make your symptoms worse.   Warm up and stretch before being active to help prevent problems.  SEEK MEDICAL CARE IF:   Your pain is not controlled with medicine.   You are unable to decrease your pain medicine over time as planned.   Your activity level is not improving as expected.  SEEK IMMEDIATE MEDICAL CARE IF:   You develop any bleeding.  You develop stomach upset.  You have signs of an allergic reaction to your medicine.   Your symptoms get worse.   You develop new, unexplained symptoms.   You have numbness, tingling, weakness, or paralysis in any part of your body.  MAKE SURE YOU:   Understand these instructions.  Will watch your condition.  Will get help right away if you are not doing well or get worse.   This information is not intended to replace advice given to you by your health care provider. Make sure you discuss any questions you have with your health care provider.   Document Released: 02/23/2007 Document Revised: 05/03/2013 Document Reviewed: 11/03/2012 Elsevier Interactive Patient  Education [employer]2016 [employer] Inc.

## 2015-10-29 NOTE — ED Notes (Signed)
Cancel orders place at 20:44- AC joints and cervical spine.  Ordered on wrong patient.
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
## 2016-02-22 ENCOUNTER — Other Ambulatory Visit: Payer: Self-pay | Admitting: Family Medicine

## 2016-02-22 DIAGNOSIS — Z1231 Encounter for screening mammogram for malignant neoplasm of breast: Secondary | ICD-10-CM

## 2016-03-11 ENCOUNTER — Ambulatory Visit
Admission: RE | Admit: 2016-03-11 | Discharge: 2016-03-11 | Disposition: A | Discharge status: Home / Self Care | Payer: BLUE CROSS/BLUE SHIELD | Source: Ambulatory Visit | Attending: Family Medicine | Admitting: Family Medicine

## 2016-03-11 DIAGNOSIS — Z1231 Encounter for screening mammogram for malignant neoplasm of breast: Secondary | ICD-10-CM

## 2017-03-02 ENCOUNTER — Other Ambulatory Visit: Payer: Self-pay | Admitting: Family Medicine

## 2017-03-02 DIAGNOSIS — Z1231 Encounter for screening mammogram for malignant neoplasm of breast: Secondary | ICD-10-CM

## 2017-03-20 ENCOUNTER — Ambulatory Visit: Payer: BLUE CROSS/BLUE SHIELD

## 2017-03-20 ENCOUNTER — Ambulatory Visit
Admission: RE | Admit: 2017-03-20 | Discharge: 2017-03-20 | Disposition: A | Discharge status: Home / Self Care | Payer: BLUE CROSS/BLUE SHIELD | Source: Ambulatory Visit | Attending: Family Medicine | Admitting: Family Medicine

## 2017-03-20 DIAGNOSIS — Z1231 Encounter for screening mammogram for malignant neoplasm of breast: Secondary | ICD-10-CM
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
## 2018-06-22 ENCOUNTER — Other Ambulatory Visit: Payer: Self-pay | Admitting: Family Medicine

## 2018-06-22 DIAGNOSIS — Z1231 Encounter for screening mammogram for malignant neoplasm of breast: Secondary | ICD-10-CM

## 2018-07-16 ENCOUNTER — Ambulatory Visit
Admission: RE | Admit: 2018-07-16 | Discharge: 2018-07-16 | Disposition: A | Discharge status: Home / Self Care | Payer: BLUE CROSS/BLUE SHIELD | Source: Ambulatory Visit | Attending: Family Medicine | Admitting: Family Medicine

## 2018-07-16 DIAGNOSIS — Z1231 Encounter for screening mammogram for malignant neoplasm of breast: Secondary | ICD-10-CM

## 2020-02-10 ENCOUNTER — Other Ambulatory Visit: Payer: Self-pay

## 2020-02-10 ENCOUNTER — Emergency Department (INDEPENDENT_AMBULATORY_CARE_PROVIDER_SITE_OTHER)
Admission: RE | Admit: 2020-02-10 | Discharge: 2020-02-10 | Disposition: A | Discharge status: Home / Self Care | Payer: BC Managed Care – PPO | Source: Ambulatory Visit

## 2020-02-10 VITALS — BP 121/83 | HR 75 | Temp 98.1°F | Resp 18

## 2020-02-10 DIAGNOSIS — G4486 Cervicogenic headache: Secondary | ICD-10-CM | POA: Diagnosis not present

## 2020-02-10 DIAGNOSIS — H9202 Otalgia, left ear: Secondary | ICD-10-CM

## 2020-02-10 MED ORDER — MELOXICAM 15 MG PO TABS: 15.0000 mg | ORAL_TABLET | Freq: Every day | ORAL | 0 refills | Status: DC

## 2020-02-10 MED ORDER — CYCLOBENZAPRINE HCL 10 MG PO TABS: ORAL_TABLET | ORAL | 1 refills | Status: DC

## 2020-02-10 NOTE — ED Triage Notes (Signed)
Pt c/o pain that radiates from back of head down neck and also into neck and jaw. Teledoc visit, and rx'd doxycycline about 10 days ago. Was also having ear issues which are better but says still hears a rattling sound in LT ear.

## 2020-02-10 NOTE — ED Provider Notes (Signed)
[facility]    CSN: [id number] Arrival date & time: 02/10/20  1847      History   Chief Complaint Chief Complaint  Patient presents with  . Otalgia  . Neck Pain    HPI Gabrielle Riddle is a 54 y.o. female.   HPI  Patient presents for evaluation of left otalgia and neck/ HA pain.She as treated for symptoms via telemedicine encounter and prescribed Doxycyline which she completed. Continues to complain of headache pain and jaw pain. No fever.  Past Medical History:  Diagnosis Date  . Thyroid disease     Patient Active Problem List   Diagnosis Date Noted  . Lichen sclerosus of female genitalia 05/22/2013  . Vitamin D deficiency 05/22/2013  . Varicose veins 05/22/2013  . GERD (gastroesophageal reflux disease) 05/22/2013  . Mood swings 05/22/2013  . Hot flashes 05/22/2013  . Heel pain 05/22/2013    Past Surgical History:  Procedure Laterality Date  . ABDOMINAL HYSTERECTOMY    . CESAREAN SECTION    . TONSILLECTOMY    . TONSILLECTOMY AND ADENOIDECTOMY      OB History   No obstetric history on file.      Home Medications    Prior to Admission medications   Medication Sig Start Date End Date Taking? Authorizing Provider  DULoxetine (CYMBALTA) 60 MG capsule Take 1 tablet by mouth daily. 01/24/20  Yes [provider]  cyclobenzaprine (FLEXERIL) 10 MG tablet Take one tab by mouth at bedtime for muscle spasm 10/29/15   Lattie Haw, MD  omeprazole (PRILOSEC) 40 MG capsule Take 1 capsule (40 mg total) by mouth daily. 05/20/13   Breeback, Jade L, PA-C  venlafaxine XR (EFFEXOR-XR) 37.5 MG 24 hr capsule Take 1 capsule (37.5 mg total) by mouth daily. For 7-10 days then increase to 2 tabs daily. 06/17/13   Jomarie Longs, PA-C    Family History Family History  Problem Relation Age of Onset  . Diabetes Mother   . Stroke Mother   . Heart attack Mother   . Cancer Father   . Hyperlipidemia Father   . Cancer Maternal Aunt   . Cancer Paternal Aunt      Social History Social History   Tobacco Use  . Smoking status: Former Games developer  . Smokeless tobacco: Never Used  Vaping Use  . Vaping Use: Never used  Substance Use Topics  . Alcohol use: Yes  . Drug use: No     Allergies   Penicillins and Sulfa antibiotics   Review of Systems Review of Systems Pertinent negatives listed in HPI Physical Exam Triage Vital Signs ED Triage Vitals  Enc Vitals Group     BP 02/10/20 1859 121/83     Pulse Rate 02/10/20 1859 75     Resp 02/10/20 1859 18     Temp 02/10/20 1859 98.1 F (36.7 C)     Temp Source 02/10/20 1859 Oral     SpO2 02/10/20 1859 99 %     Weight --      Height --      Head Circumference --      Peak Flow --      Pain Score 02/10/20 1903 2     Pain Loc --      Pain Edu? --      Excl. in GC? --    No data found.  Updated Vital Signs BP 121/83 (BP Location: Left Arm)   Pulse 75   Temp 98.1 F (36.7 C) (Oral)  Resp 18   SpO2 99%   Visual Acuity Right Eye Distance:   Left Eye Distance:   Bilateral Distance:    Right Eye Near:   Left Eye Near:    Bilateral Near:     Physical Exam Constitutional:      Appearance: Normal appearance.  HENT:     Head: Normocephalic.     Right Ear: Hearing normal.     Left Ear: Hearing normal.     Nose: Nose normal.     Mouth/Throat:     Lips: Pink.     Mouth: Mucous membranes are moist.  Cardiovascular:     Rate and Rhythm: Normal rate and regular rhythm.     Heart sounds: Normal heart sounds.  Musculoskeletal:     Cervical back: Normal range of motion and neck supple. Pain with movement and muscular tenderness present.  Neurological:     Mental Status: She is alert.     GCS: GCS eye subscore is 4. GCS verbal subscore is 5. GCS motor subscore is 6.  Psychiatric:        Attention and Perception: Attention normal.        Mood and Affect: Mood normal.      UC Treatments / Results  Labs (all labs ordered are listed, but only abnormal results are  displayed) Labs Reviewed - No data to display  EKG   Radiology No results found.  Procedures Procedures (including critical care time)  Medications Ordered in UC Medications - No data to display  Initial Impression / Assessment and Plan / UC Course  I have reviewed the triage vital signs and the nursing notes.  Pertinent labs & imaging results that were available during my care of the patient were reviewed by me and considered in my medical decision making (see chart for details).    Acute otalgia, suspect eustachian tube dysfunction, left and cervicogenic HA. Medication prescribed per discharge orders. Follow-up with PCP if symptoms worsen or do not improve.  Final Clinical Impressions(s) / UC Diagnoses   Final diagnoses:  Cervicogenic headache  Otalgia, left   Discharge Instructions   None    ED Prescriptions    Medication Sig Dispense Auth. Provider   cyclobenzaprine (FLEXERIL) 10 MG tablet  (Status: Discontinued) Take one tab by mouth at bedtime for muscle spasm 10 tablet Bing Neighbors, FNP   meloxicam (MOBIC) 15 MG tablet Take 1 tablet (15 mg total) by mouth daily. 30 tablet Bing Neighbors, FNP   cyclobenzaprine (FLEXERIL) 10 MG tablet Take one tab by mouth at bedtime for muscle spasm 20 tablet Bing Neighbors, FNP     PDMP not reviewed this encounter.   Bing Neighbors, FNP 02/13/20 1909

## 2020-08-15 ENCOUNTER — Other Ambulatory Visit: Payer: Self-pay | Admitting: Family Medicine

## 2020-08-15 DIAGNOSIS — Z1231 Encounter for screening mammogram for malignant neoplasm of breast: Secondary | ICD-10-CM

## 2020-10-03 ENCOUNTER — Ambulatory Visit
Admission: RE | Admit: 2020-10-03 | Discharge: 2020-10-03 | Disposition: A | Discharge status: Home / Self Care | Payer: BC Managed Care – PPO | Source: Ambulatory Visit | Attending: Family Medicine | Admitting: Family Medicine

## 2020-10-03 ENCOUNTER — Other Ambulatory Visit: Payer: Self-pay

## 2020-10-03 DIAGNOSIS — Z1231 Encounter for screening mammogram for malignant neoplasm of breast: Secondary | ICD-10-CM

## 2021-09-05 ENCOUNTER — Emergency Department (INDEPENDENT_AMBULATORY_CARE_PROVIDER_SITE_OTHER)
Admission: EM | Admit: 2021-09-05 | Discharge: 2021-09-05 | Disposition: A | Discharge status: Home / Self Care | Payer: BC Managed Care – PPO | Source: Home / Self Care

## 2021-09-05 ENCOUNTER — Encounter: Payer: Self-pay | Admitting: Emergency Medicine

## 2021-09-05 ENCOUNTER — Other Ambulatory Visit: Payer: Self-pay

## 2021-09-05 DIAGNOSIS — R42 Dizziness and giddiness: Secondary | ICD-10-CM | POA: Diagnosis not present

## 2021-09-05 MED ORDER — MECLIZINE HCL 25 MG PO TABS: 25.0000 mg | ORAL_TABLET | Freq: Three times a day (TID) | ORAL | 0 refills | Status: AC | PRN

## 2021-09-05 NOTE — ED Triage Notes (Signed)
Dizziness, nausea, vomited x 2 today. ?Unvaccinated ?

## 2021-09-05 NOTE — ED Provider Notes (Signed)
?[facility] ? ? ? ?CSN: [id number] ?Arrival date & time: 09/05/21  1131 ? ? ?  ? ?History   ?Chief Complaint ?Chief Complaint  ?Patient presents with  ? Dizziness  ? ? ?HPI ?Kemisha Azzarello is a 56 y.o. female.  ? ?HPI 56 year old female presents with dizziness nausea and vomiting for 2 days.  PMH significant for today, hemochromatosis, and vitamin D deficiency. ? ?Past Medical History:  ?Diagnosis Date  ? Thyroid disease   ? ? ?Patient Active Problem List  ? Diagnosis Date Noted  ? 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 Vitamin D deficiency 05/22/2013  ? Varicose veins 05/22/2013  ? GERD (gastroesophageal reflux disease) 05/22/2013  ? Mood swings 05/22/2013  ? Hot flashes 05/22/2013  ? Heel pain 05/22/2013  ? ? ?Past Surgical History:  ?Procedure Laterality Date  ? ABDOMINAL HYSTERECTOMY    ? CESAREAN SECTION    ? TONSILLECTOMY    ? TONSILLECTOMY AND ADENOIDECTOMY    ? ? ?OB History   ?No obstetric history on file. ?  ? ? ? ?Home Medications   ? ?Prior to Admission medications   ?Medication Sig Start Date End Date Taking? Authorizing Provider  ?meclizine (ANTIVERT) 25 MG tablet Take 1 tablet (25 mg total) by mouth 3 (three) times daily as needed for dizziness. 09/05/21  Yes Trevor Iha, FNP  ?meclizine (ANTIVERT) 25 MG tablet Take 1 tablet (25 mg total) by mouth 3 (three) times daily as needed for dizziness. 09/05/21  Yes Trevor Iha, FNP  ?DULoxetine (CYMBALTA) 60 MG capsule Take 1 tablet by mouth daily. 01/24/20   [provider]  ?omeprazole (PRILOSEC) 40 MG capsule Take 1 capsule (40 mg total) by mouth daily. 05/20/13   Jomarie Longs, PA-C  ? ? ?Family History ?Family History  ?Problem Relation Age of Onset  ? Diabetes Mother   ? Stroke Mother   ? Heart attack Mother   ? Cancer Father   ? Hyperlipidemia Father   ? Cancer Maternal Aunt   ? Breast cancer Maternal Aunt 52  ? Cancer Paternal Aunt   ? ? ?Social History ?Social History  ? ?Tobacco Use  ? Smoking status: Former  ?  Smokeless tobacco: Never  ?Vaping Use  ? Vaping Use: Never used  ?Substance Use Topics  ? Alcohol use: Yes  ? Drug use: No  ? ? ? ?Allergies   ?Penicillins and Sulfa antibiotics ? ? ?Review of Systems ?Review of Systems  ?Gastrointestinal:  Positive for nausea and vomiting.  ?Neurological:  Positive for dizziness.  ?All other systems reviewed and are negative. ? ? ?Physical Exam ?Triage Vital Signs ?ED Triage Vitals  ?Enc Vitals Group  ?   BP 09/05/21 1151 (!) 141/85  ?   Pulse Rate 09/05/21 1151 80  ?   Resp 09/05/21 1151 18  ?   Temp 09/05/21 1151 98.3 ?F (36.8 ?C)  ?   Temp Source 09/05/21 1151 Oral  ?   SpO2 09/05/21 1151 98 %  ?   Weight 09/05/21 1152 175 lb (79.4 kg)  ?   Height 09/05/21 1152 5\' 2"  (1.575 m)  ?   Head Circumference --   ?   Peak Flow --   ?   Pain Score 09/05/21 1152 0  ?   Pain Loc --   ?   Pain Edu? --   ?   Excl. in GC? --   ? ?No data found. ? ?Updated Vital Signs ?BP (!) 141/85 (BP Location:  Left Arm)   Pulse 80   Temp 98.3 ?F (36.8 ?C) (Oral)   Resp 18   Ht 5\' 2"  (1.575 m)   Wt 175 lb (79.4 kg)   SpO2 98%   BMI 32.01 kg/m?  ? ?  ? ?Physical Exam ?Vitals and nursing note reviewed.  ?Constitutional:   ?   Appearance: Normal appearance. She is normal weight.  ?HENT:  ?   Head: Normocephalic and atraumatic.  ?   Right Ear: Tympanic membrane, ear canal and external ear normal.  ?   Left Ear: Tympanic membrane, ear canal and external ear normal.  ?   Mouth/Throat:  ?   Mouth: Mucous membranes are moist.  ?   Pharynx: Oropharynx is clear.  ?Eyes:  ?   Extraocular Movements: Extraocular movements intact.  ?   Conjunctiva/sclera: Conjunctivae normal.  ?   Pupils: Pupils are equal, round, and reactive to light.  ?Cardiovascular:  ?   Rate and Rhythm: Normal rate and regular rhythm.  ?   Pulses: Normal pulses.  ?   Heart sounds: Normal heart sounds.  ?Pulmonary:  ?   Effort: Pulmonary effort is normal.  ?   Breath sounds: Normal breath sounds. No wheezing, rhonchi or rales.   ?Musculoskeletal:  ?   Cervical back: Normal range of motion and neck supple.  ?Skin: ?   General: Skin is warm and dry.  ?Neurological:  ?   General: No focal deficit present.  ?   Mental Status: She is alert and oriented to person, place, and time. Mental status is at baseline.  ?   Cranial Nerves: No cranial nerve deficit.  ?   Sensory: No sensory deficit.  ?   Motor: No weakness.  ?   Coordination: Coordination normal.  ?   Gait: Gait normal.  ?   Comments: Grip is 5/5 negative Romberg, negative pronator drift  ? ? ? ?UC Treatments / Results  ?Labs ?(all labs ordered are listed, but only abnormal results are displayed) ?Labs Reviewed - No data to display ? ?EKG ? ? ?Radiology ?No results found. ? ?Procedures ?Procedures (including critical care time) ? ?Medications Ordered in UC ?Medications - No data to display ? ?Initial Impression / Assessment and Plan / UC Course  ?I have reviewed the triage vital signs and the nursing notes. ? ?Pertinent labs & imaging results that were available during my care of the patient were reviewed by me and considered in my medical decision making (see chart for details). ? ?  ? ?MDM: 1.  Dizziness-EKG revealed normal sinus rhythm, will treat empirically with meclizine. Advised patient EKG was within normal limits today with normal sinus rhythm.  Advised patient to take medication as directed.  Advised patient if dizziness continues, worsens, and/or unresolved please follow-up with PCP, here, or nearest ED. patient discharged home, hemodynamically stable. ?Final Clinical Impressions(s) / UC Diagnoses  ? ?Final diagnoses:  ?Dizziness  ? ? ? ?Discharge Instructions   ? ?  ?Advised patient EKG was within normal limits today with normal sinus rhythm.  Advised patient to take medication as directed.  Advised patient if dizziness continues, worsens, and/or unresolved please follow-up with PCP, here, or nearest ED. ? ? ? ? ?ED Prescriptions   ? ? Medication Sig Dispense Auth. Provider  ?  meclizine (ANTIVERT) 25 MG tablet Take 1 tablet (25 mg total) by mouth 3 (three) times daily as needed for dizziness. 30 tablet , FNP  ? meclizine (ANTIVERT) 25 MG tablet  Take 1 tablet (25 mg total) by mouth 3 (three) times daily as needed for dizziness. 30 tablet Tyrianna Lightle, MicTrevor Ihahael, FNP  ? ?  ? ?PDMP not reviewed this encounter. ?  ?Trevor IhaRagan, Warnell Rasnic, FNP ?09/05/21 1302 ? ?

## 2021-09-05 NOTE — Discharge Instructions (Addendum)
Advised patient EKG was within normal limits today with normal sinus rhythm.  Advised patient to take medication as directed.  Advised patient if dizziness continues, worsens, and/or unresolved please follow-up with PCP, here, or nearest ED. ?

## 2024-05-09 ENCOUNTER — Other Ambulatory Visit: Payer: Self-pay | Admitting: Medical Genetics
